# Patient Record
Sex: Male | Born: 1980 | Hispanic: No | Marital: Single | Smoking: Never smoker
Health system: Southern US, Community
[De-identification: ages and names within clinical notes are randomized; demographics above are authoritative.]

## PROBLEM LIST (undated history)

## (undated) DIAGNOSIS — E039 Hypothyroidism, unspecified: Secondary | ICD-10-CM

## (undated) HISTORY — DX: Hypothyroidism, unspecified: E03.9

---

## 2010-04-28 ENCOUNTER — Emergency Department (HOSPITAL_COMMUNITY)
Admission: EM | Admit: 2010-04-28 | Discharge: 2010-04-28 | Payer: Self-pay | Source: Home / Self Care | Admitting: Emergency Medicine

## 2010-04-28 LAB — RAPID STREP SCREEN (MED CTR MEBANE ONLY): Streptococcus, Group A Screen (Direct): NEGATIVE

## 2012-12-27 ENCOUNTER — Encounter: Payer: Self-pay | Admitting: Family Medicine

## 2012-12-27 ENCOUNTER — Ambulatory Visit (INDEPENDENT_AMBULATORY_CARE_PROVIDER_SITE_OTHER): Payer: BC Managed Care – HMO | Admitting: Family Medicine

## 2012-12-27 VITALS — BP 126/75 | HR 60 | Ht 69.0 in | Wt 171.0 lb

## 2012-12-27 DIAGNOSIS — IMO0002 Reserved for concepts with insufficient information to code with codable children: Secondary | ICD-10-CM

## 2012-12-27 DIAGNOSIS — S86899A Other injury of other muscle(s) and tendon(s) at lower leg level, unspecified leg, initial encounter: Secondary | ICD-10-CM

## 2012-12-27 NOTE — Patient Instructions (Addendum)
You have shin splints (medial tibial stress syndrome) Take aleve 2 tabs twice a day with food OR ibuprofen 3 tabs three times a day with food for pain and inflammation for next 3-4 weeks. Icing 3-4 times a day and after activity for 15 minutes at a time Cut down activities by 20-50% (especially running). Orthotics or shoe inserts may be helpful if you have foot breakdown or high arches - try Dr. Jari Sportsman active series insoles. Cross train with non-impact activities (cycling, swimming) every other day. Step exercise I showed you 3 sets of 10 once a day. Consider custom orthotics if you continue to struggle. Follow up with me in 6 weeks or as needed.

## 2012-12-28 ENCOUNTER — Encounter: Payer: Self-pay | Admitting: Family Medicine

## 2012-12-28 DIAGNOSIS — S86899A Other injury of other muscle(s) and tendon(s) at lower leg level, unspecified leg, initial encounter: Secondary | ICD-10-CM | POA: Insufficient documentation

## 2012-12-28 NOTE — Progress Notes (Signed)
Patient ID: Alexander Walsh, male   DOB: March 08, 1981, 32 y.o.   MRN: [id number]  PCP: No primary provider on file.  Subjective:   HPI: Patient is a 32 y.o. male here for bilateral shin pain.  Patient reports he started running about 4 months ago. Worked his way up to doing more than 15 miles a week. Reports past 1 1/2 to 2 months has had pain bilateral shins over a wide area. No swelling, bruising. Seems to feel better in middle of run - worse beginning and end of runs. Has been icing.  Past Medical History  Diagnosis Date  . Hypothyroidism     No current outpatient prescriptions on file prior to visit.   No current facility-administered medications on file prior to visit.    History reviewed. No pertinent past surgical history.  No Known Allergies  History   Social History  . Marital Status: Single    Spouse Name: N/A    Number of Children: N/A  . Years of Education: N/A   Occupational History  . Not on file.   Social History Main Topics  . Smoking status: Never Smoker   . Smokeless tobacco: Not on file  . Alcohol Use: Not on file  . Drug Use: Not on file  . Sexual Activity: Not on file   Other Topics Concern  . Not on file   Social History Narrative  . No narrative on file    Family History  Problem Relation Age of Onset  . Sudden death Neg Hx   . Hyperlipidemia Neg Hx   . Heart attack Neg Hx   . Diabetes Neg Hx   . Hypertension Neg Hx     BP 126/75  Pulse 60  Ht 5\' 9"  (1.753 m)  Wt 171 lb (77.565 kg)  BMI 25.24 kg/m2  Review of Systems: See HPI above.    Objective:  Physical Exam:  Gen: NAD  Bilateral lower legs: No deformity, swelling, bruising.  Overpronation, pes planus. TTP middle 1/3rd tibias.  No other TTP. FROM ankle and knees without pain. Negative thompsons. Negative hop tests.  MSK u/s:  No evidence cortical irregularity, edema overlying cortices, neovascularity.    Assessment & Plan:  1. Bilateral shin splints -  discussed relative rest, icing, tylenol/nsaids.  Orthotics for cushion and to help with overpronation.  Cross train in meantime.  Calf raise/lower exercise.  F/u in 6 weeks or as needed.

## 2012-12-28 NOTE — Assessment & Plan Note (Signed)
discussed relative rest, icing, tylenol/nsaids.  Orthotics for cushion and to help with overpronation.  Cross train in meantime.  Calf raise/lower exercise.  F/u in 6 weeks or as needed.

## 2014-01-08 ENCOUNTER — Ambulatory Visit
Admission: RE | Admit: 2014-01-08 | Discharge: 2014-01-08 | Disposition: A | Discharge status: Home / Self Care | Payer: BC Managed Care – PPO | Source: Ambulatory Visit | Attending: Chiropractic Medicine | Admitting: Chiropractic Medicine

## 2014-01-08 ENCOUNTER — Other Ambulatory Visit: Payer: Self-pay | Admitting: Chiropractic Medicine

## 2014-01-08 DIAGNOSIS — M79662 Pain in left lower leg: Secondary | ICD-10-CM

## 2015-02-12 ENCOUNTER — Encounter: Payer: Self-pay | Admitting: Sports Medicine

## 2015-02-12 ENCOUNTER — Ambulatory Visit (INDEPENDENT_AMBULATORY_CARE_PROVIDER_SITE_OTHER): Payer: BLUE CROSS/BLUE SHIELD

## 2015-02-12 ENCOUNTER — Ambulatory Visit (INDEPENDENT_AMBULATORY_CARE_PROVIDER_SITE_OTHER): Payer: BLUE CROSS/BLUE SHIELD | Admitting: Sports Medicine

## 2015-02-12 DIAGNOSIS — S86891A Other injury of other muscle(s) and tendon(s) at lower leg level, right leg, initial encounter: Secondary | ICD-10-CM

## 2015-02-12 DIAGNOSIS — M79661 Pain in right lower leg: Secondary | ICD-10-CM | POA: Diagnosis not present

## 2015-02-12 MED ORDER — MELOXICAM 15 MG PO TABS: ORAL_TABLET | ORAL | Status: DC

## 2015-02-12 NOTE — Assessment & Plan Note (Signed)
Predominantly right-sided, good hip abductor strength. X-rays, adding meloxicam, return for custom orthotics. He does have an area of fullness and focal tenderness over the medial tibia or some for a stress injury.

## 2015-02-12 NOTE — Progress Notes (Signed)
   Subjective:    I'm seeing this patient as a consultation for:  Dr. Luretha RuedPriya Paruchuri.  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 Overall his pain is getting better but this has been recurrent. Moderate, persistent, localized without radiation. No trauma. No constitutional symptoms.  Past medical history, Surgical history, Family history not pertinant except as noted below, Social history, Allergies, and medications have been entered into the medical record, reviewed, and no changes needed.   Review of Systems: No headache, visual changes, nausea, vomiting, diarrhea, constipation, dizziness, abdominal pain, skin rash, fevers, chills, night sweats, weight loss, swollen lymph nodes, body aches, joint swelling, muscle aches, chest pain, shortness of breath, mood changes, visual or auditory hallucinations.   Objective:   General: Well Developed, well nourished, and in no acute distress.  Neuro/Psych: Alert and oriented x3, extra-ocular muscles intact, able to move all 4 extremities, sensation grossly intact. Skin: Warm and dry, no rashes noted.  Respiratory: Not using accessory muscles, speaking in full sentences, trachea midline.  Cardiovascular: Pulses palpable, no extremity edema. Abdomen: Does not appear distended. Right Ankle: No visible erythema or swelling. Range of motion is full in all directions. Strength is 5/5 in all directions. Stable lateral and medial ligaments; squeeze test and kleiger test unremarkable; Talar dome nontender; No pain at base of 5th MT; No tenderness over cuboid; No tenderness over N spot or navicular prominence Tender to palpation at the posterior medial border of the junction of the middle and distal thirds of the tibia. No sign of peroneal tendon subluxations; Negative tarsal  tunnel tinel's Able to walk 4 steps.  X-rays personally reviewed and are unremarkable.  Impression and Recommendations:   This case required medical decision making of moderate complexity.

## 2015-02-25 ENCOUNTER — Encounter: Payer: Self-pay | Admitting: Sports Medicine

## 2015-02-25 ENCOUNTER — Ambulatory Visit (INDEPENDENT_AMBULATORY_CARE_PROVIDER_SITE_OTHER): Payer: BLUE CROSS/BLUE SHIELD | Admitting: Sports Medicine

## 2015-02-25 VITALS — BP 126/77 | HR 62

## 2015-02-25 DIAGNOSIS — S86891A Other injury of other muscle(s) and tendon(s) at lower leg level, right leg, initial encounter: Secondary | ICD-10-CM

## 2015-02-25 NOTE — Assessment & Plan Note (Addendum)
X-ray evidence of an old stress fracture, continue NSAIDs, custom orthotics as above. Adding an Aircast. Avoid running for 2 weeks and then may restart training for marathon. Return in one month.

## 2015-02-25 NOTE — Progress Notes (Signed)

## 2015-04-29 ENCOUNTER — Encounter: Payer: Self-pay | Admitting: Sports Medicine

## 2015-04-29 ENCOUNTER — Ambulatory Visit (INDEPENDENT_AMBULATORY_CARE_PROVIDER_SITE_OTHER): Payer: BLUE CROSS/BLUE SHIELD | Admitting: Sports Medicine

## 2015-04-29 VITALS — BP 134/74 | HR 65 | Resp 18 | Wt 168.3 lb

## 2015-04-29 DIAGNOSIS — S86891D Other injury of other muscle(s) and tendon(s) at lower leg level, right leg, subsequent encounter: Secondary | ICD-10-CM | POA: Diagnosis not present

## 2015-04-29 NOTE — Assessment & Plan Note (Signed)
X-ray did show evidence of a tibial stress fracture. This has completely resolved with custom orthotics and an Aircast. He does have a bit of medial tibial pain bilaterally that resembles medial tibial stress syndrome, we added a small scaphoid pad into both orthotics, and he will continue to increase his mileage. I have asked him to touch base with me on mychart in one month to see how things are going.

## 2015-04-29 NOTE — Progress Notes (Addendum)
  Subjective:    CC: Follow-up  HPI: This is a pleasant 35 year old male runner, we diagnosed him with a tibial stress fracture with x-ray evidence of periosteal reaction. I placed him and custom orthotics and an Aircast, he returns today with all pain at the right medial tibia resolved, he has recently gotten back into running and is doing approximately 4 miles at a time, and has only mild, relatively diffuse pain at the medial tibia bilaterally.  Past medical history, Surgical history, Family history not pertinant except as noted below, Social history, Allergies, and medications have been entered into the medical record, reviewed, and no changes needed.   Review of Systems: No fevers, chills, night sweats, weight loss, chest pain, or shortness of breath.   Objective:    General: Well Developed, well nourished, and in no acute distress.  Neuro: Alert and oriented x3, extra-ocular muscles intact, sensation grossly intact.  HEENT: Normocephalic, atraumatic, pupils equal round reactive to light, neck supple, no masses, no lymphadenopathy, thyroid nonpalpable.  Skin: Warm and dry, no rashes. Cardiac: Regular rate and rhythm, no murmurs rubs or gallops, no lower extremity edema.  Respiratory: Clear to auscultation bilaterally. Not using accessory muscles, speaking in full sentences. Bilateral Ankles: No visible erythema or swelling. Range of motion is full in all directions. Strength is 5/5 in all directions. Stable lateral and medial ligaments; squeeze test and kleiger test unremarkable; Talar dome nontender; No pain at base of 5th MT; No tenderness over cuboid; No tenderness over N spot or navicular prominence No tenderness on posterior aspects of lateral and medial malleolus No sign of peroneal tendon subluxations; Negative tarsal tunnel tinel's Able to walk 4 steps.  Impression and Recommendations:    I spent 25 minutes with this patient, greater than 50% was face-to-face time  counseling regarding the above diagnoses

## 2016-04-08 ENCOUNTER — Ambulatory Visit (INDEPENDENT_AMBULATORY_CARE_PROVIDER_SITE_OTHER): Payer: BLUE CROSS/BLUE SHIELD | Admitting: Sports Medicine

## 2016-04-08 ENCOUNTER — Ambulatory Visit (INDEPENDENT_AMBULATORY_CARE_PROVIDER_SITE_OTHER): Payer: BLUE CROSS/BLUE SHIELD

## 2016-04-08 ENCOUNTER — Encounter: Payer: Self-pay | Admitting: Sports Medicine

## 2016-04-08 DIAGNOSIS — S76012A Strain of muscle, fascia and tendon of left hip, initial encounter: Secondary | ICD-10-CM | POA: Diagnosis not present

## 2016-04-08 DIAGNOSIS — M25552 Pain in left hip: Secondary | ICD-10-CM

## 2016-04-08 MED ORDER — MELOXICAM 15 MG PO TABS: ORAL_TABLET | ORAL | 3 refills | Status: DC

## 2016-04-08 NOTE — Progress Notes (Signed)
   Subjective:    I'm seeing this patient as a consultation for:  Dr. Louis MeckelParuchuri CC: Left hip pain  HPI: Patient is 36yo male presenting with left hip pain since his previous marathon on 02/05/16.  Patient states his pain is worse when he bends his hip or after mile 3 of this tempo runs.  Patient states he can run through the pain.  Patient denies any numbness or tingling of left leg.  Denies knee pain or back pain. Patient states he has tried yoga, which he wonders if has made his hip worse.  Patient states the only thing he has tried for the pain is ice yesterday with minimal relief.  Patient is worried because he wants to increase his workouts for his next race in March.      Past medical history:  Negative.  See flowsheet/record as well for more information.  Surgical history: Negative.  See flowsheet/record as well for more information.  Family history: Negative.  See flowsheet/record as well for more information.  Social history: Negative.  See flowsheet/record as well for more information.  Allergies, and medications have been entered into the medical record, reviewed, and no changes needed.   Review of Systems: No headache, visual changes, nausea, vomiting, diarrhea, constipation, dizziness, abdominal pain, skin rash, fevers, chills, night sweats, weight loss, swollen lymph nodes, body aches, joint swelling, muscle aches, chest pain, shortness of breath, mood changes, visual or auditory hallucinations.   Objective:   General: Well Developed, well nourished, and in no acute distress.  Neuro/Psych: Alert and oriented x3, extra-ocular muscles intact, able to move all 4 extremities, sensation grossly intact. Skin: Warm and dry, no rashes noted.  Respiratory: Not using accessory muscles, speaking in full sentences, trachea midline.  Cardiovascular: Pulses palpable, no extremity edema. Abdomen: Does not appear distended. Left Hip: ROM IR: 45 Deg, ER: 45 Deg, Flexion: 120 Deg, Extension:  100 Deg, Abduction: 45 Deg, Adduction: 45 Deg Pain with flexion but appropriate ROM.   Strength IR: 5/5, ER: 5/5, Flexion: 5/5, Extension: 5/5, Abduction: 4/5, Adduction: 5/5 Weakness of abductors.   Pelvic alignment unremarkable to inspection and palpation. Standing hip rotation and gait without trendelenburg sign / unsteadiness. Greater trochanter without tenderness to palpation. No tenderness over piriformis. No pain with FABER or FADIR. No SI joint tenderness and normal minimal SI movement.       Impression and Recommendations:   This case required medical decision making of moderate complexity.  No problem-specific Assessment & Plan notes found for this encounter.     Marland Kitchen.Ok Edwards.Darek was seen today for hip pain.  Diagnoses and all orders for this visit:  Strain of flexor muscle of left hip, initial encounter -     meloxicam (MOBIC) 15 MG tablet; One tab PO qAM with breakfast for 2 weeks, then daily prn pain. -     DG HIP UNILAT WITH PELVIS 2-3 VIEWS LEFT; Future  Patient has a flexor muscle strain. Patient informed he can take Mobic 15mg  for pain relief.  He was also given instructions for hip exercises to do for the next 4 weeks to strengthen his hip abductors and flexors.  Patient also instructed to run a maximum of 10 miles this week, and he can increase his workouts the following week.  Patient instructed he can continue his yoga exercises as well since they are likely helping to strength his hip abductors.   Patient needs to follow-up in 4 weeks.

## 2016-04-08 NOTE — Assessment & Plan Note (Signed)
Pain for one month now after a marathon. Left hip x-rays, meloxicam, hip flexor rehabilitation exercises. Also has fairly weak hip abductor's which she will work on.  Return to see me in one month.

## 2016-05-06 ENCOUNTER — Ambulatory Visit (INDEPENDENT_AMBULATORY_CARE_PROVIDER_SITE_OTHER): Payer: BLUE CROSS/BLUE SHIELD | Admitting: Sports Medicine

## 2016-05-06 ENCOUNTER — Encounter: Payer: Self-pay | Admitting: Sports Medicine

## 2016-05-06 DIAGNOSIS — S76012D Strain of muscle, fascia and tendon of left hip, subsequent encounter: Secondary | ICD-10-CM | POA: Diagnosis not present

## 2016-05-06 NOTE — Progress Notes (Signed)
  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 He has improved significantly, he was pain-free until going up on his mileage to past 20 miles per week. Only with a slight recurrence of pain, he did stop his meloxicam. Next 20 takes he continues to be totally pain-free. Admits to not being fully diligent with his rehabilitation exercises.  Past medical history:  Negative.  See flowsheet/record as well for more information.  Surgical history: Negative.  See flowsheet/record as well for more information.  Family history: Negative.  See flowsheet/record as well for more information.  Social history: Negative.  See flowsheet/record as well for more information.  Allergies, and medications have been entered into the medical record, reviewed, and no changes needed.   Review of Systems: No fevers, chills, night sweats, weight loss, chest pain, or shortness of breath.   Objective:    General: Well Developed, well nourished, and in no acute distress.  Neuro: Alert and oriented x3, extra-ocular muscles intact, sensation grossly intact.  HEENT: Normocephalic, atraumatic, pupils equal round reactive to light, neck supple, no masses, no lymphadenopathy, thyroid nonpalpable.  Skin: Warm and dry, no rashes. Cardiac: Regular rate and rhythm, no murmurs rubs or gallops, no lower extremity edema.  Respiratory: Clear to auscultation bilaterally. Not using accessory muscles, speaking in full sentences. Left Hip: ROM IR: 60 Deg, ER: 60 Deg, Flexion: 120 Deg, Extension: 100 Deg, Abduction: 45 Deg, Adduction: 45 Deg Strength IR: 5/5, ER: 5/5, Flexion: 5/5, Extension: 5/5, Abduction: 5/5, Adduction: 5/5, mild reproduction of pain with resisted flexion of the hip Pelvic alignment unremarkable to inspection and palpation. Standing hip rotation and gait without trendelenburg /  unsteadiness. Greater trochanter without tenderness to palpation. No tenderness over piriformis. No SI joint tenderness and normal minimal SI movement.  Impression and Recommendations:    Strain of flexor muscle of left hip Fantastic improvement, up to 26 miles per week. He will decreased back to 20 miles per week and continue meloxicam. He does need to increase his diligence with the physical therapy, I am going to send him to a physical therapist of his choice.  I spent 25 minutes with this patient, greater than 50% was face-to-face time counseling regarding the above diagnoses

## 2016-05-06 NOTE — Assessment & Plan Note (Signed)
Fantastic improvement, up to 26 miles per week. He will decreased back to 20 miles per week and continue meloxicam. He does need to increase his diligence with the physical therapy, I am going to send him to a physical therapist of his choice.

## 2016-06-03 ENCOUNTER — Ambulatory Visit: Payer: BLUE CROSS/BLUE SHIELD | Admitting: Sports Medicine

## 2016-07-28 IMAGING — CR DG TIBIA/FIBULA 2V*R*
4 series · 4 of 4 positions shown · non-contrast
Comparison: None.

CLINICAL DATA: Tender to palpation along the medial tibial border.

EXAM:
RIGHT TIBIA AND FIBULA - 2 VIEW

[tibia ap (1 of 2)]
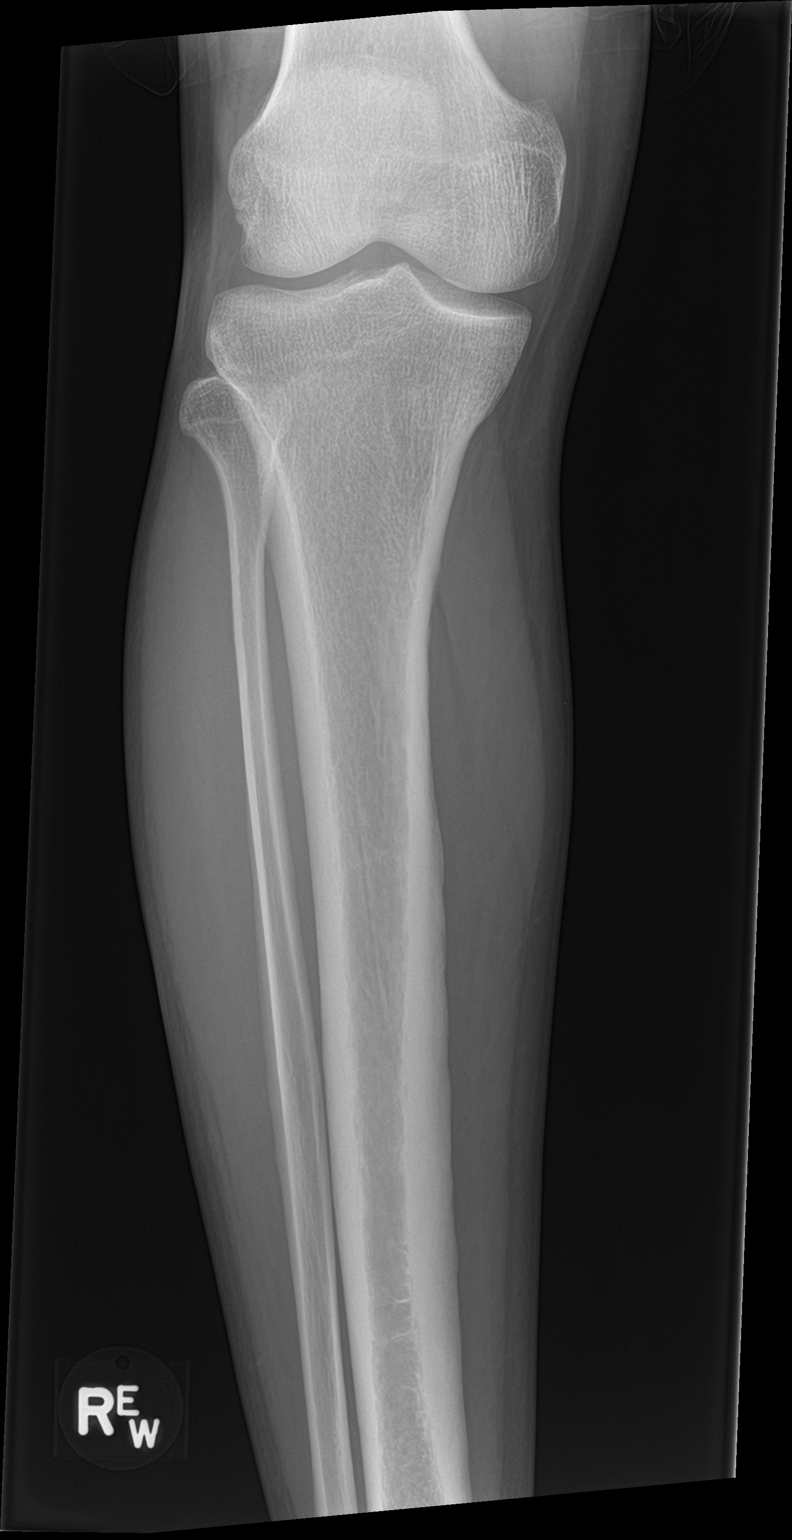

[tibia ap (2 of 2)]
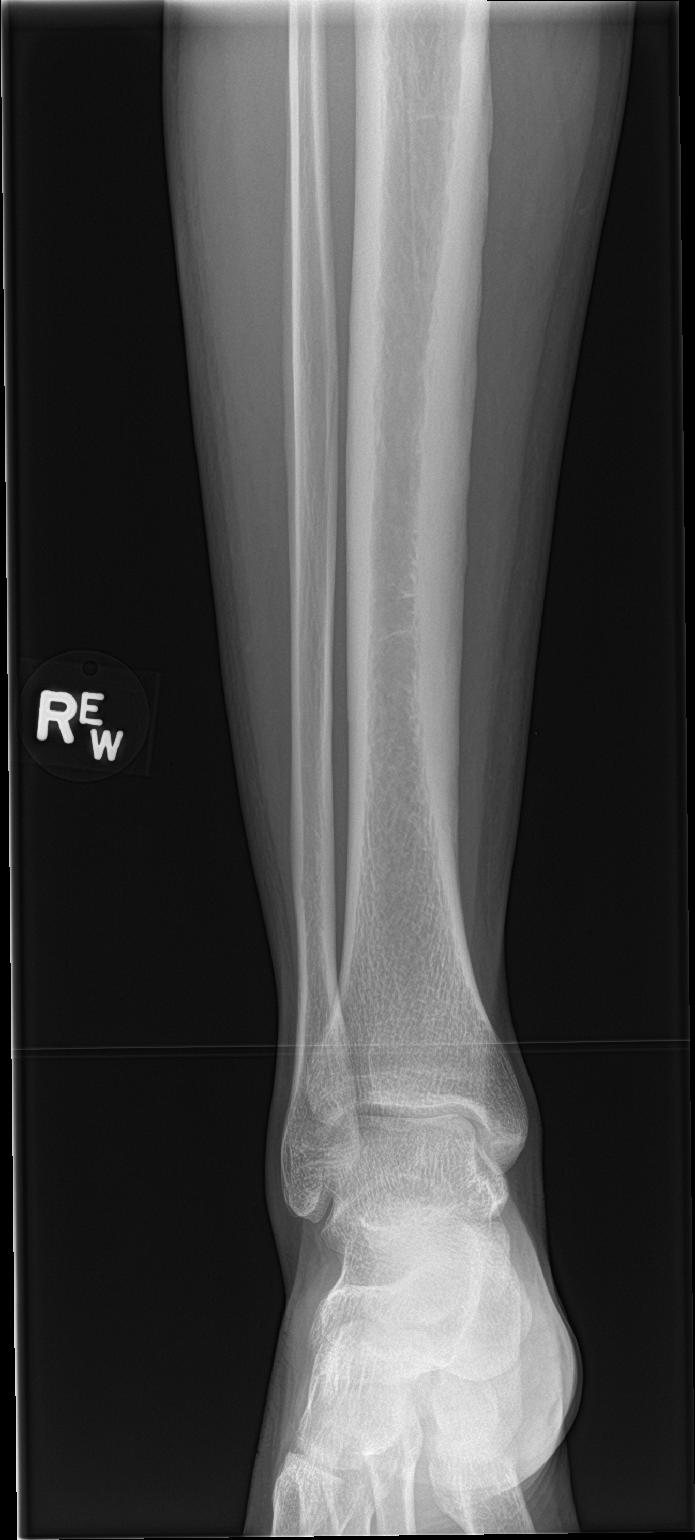

[tibia lat (1 of 2)]
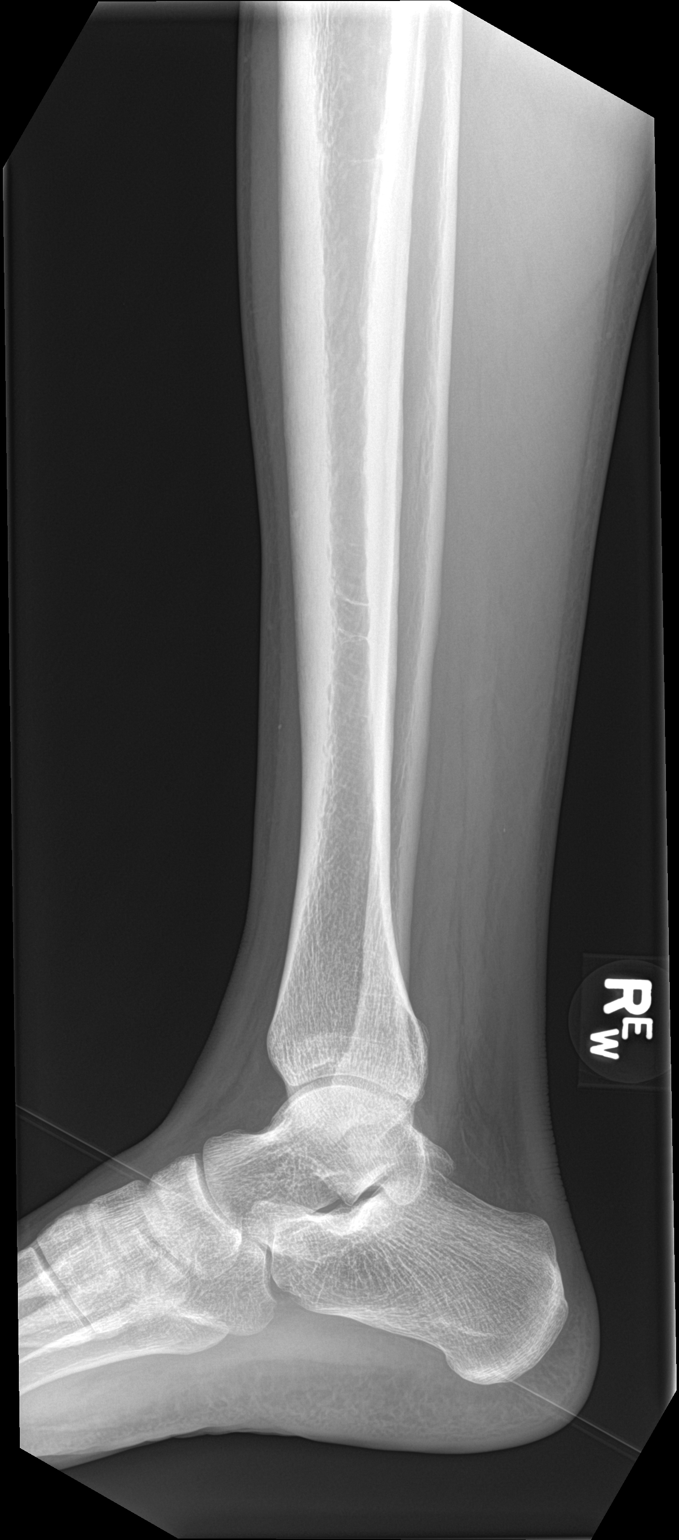

[tibia lat (2 of 2)]
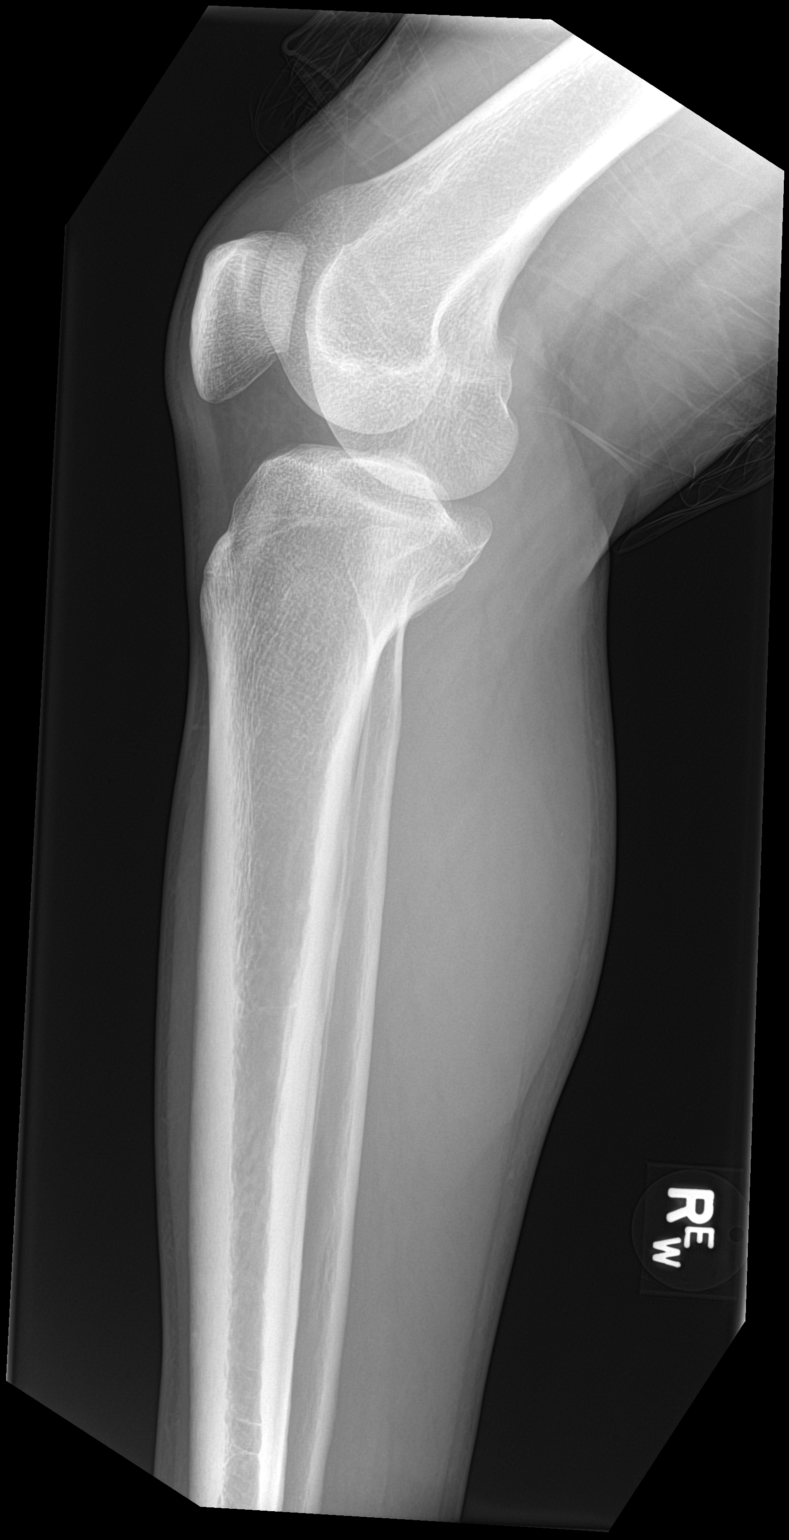

[4 of 4 positions shown; findings below may reference images not displayed]

FINDINGS: There is no evidence of fracture or other focal bone lesions. There
is cortical thickening along the medial distal tibial diaphysis
which may be secondary to chronic stress reaction. Soft tissues are
unremarkable.
IMPRESSION: No acute osseous injury of the right tibia or fibula.

There is cortical thickening along the medial distal tibial
diaphysis which may be secondary to chronic stress reaction.

## 2016-08-03 ENCOUNTER — Encounter: Payer: Self-pay | Admitting: Sports Medicine

## 2016-08-03 ENCOUNTER — Ambulatory Visit (INDEPENDENT_AMBULATORY_CARE_PROVIDER_SITE_OTHER): Payer: BLUE CROSS/BLUE SHIELD | Admitting: Sports Medicine

## 2016-08-03 DIAGNOSIS — S76012D Strain of muscle, fascia and tendon of left hip, subsequent encounter: Secondary | ICD-10-CM | POA: Diagnosis not present

## 2016-08-03 NOTE — Assessment & Plan Note (Signed)
Completely resolved, he will continue to work on midfoot and forefoot running, has been doing all aerobic exercise, needs to add some resistance training particularly for the hip flexors and extensors.

## 2016-08-03 NOTE — Progress Notes (Signed)
   Subjective:    I'm seeing this patient as a consultation for:    CC: follow-up left hip flexor strain  HPI: 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 Hip pain has completely resolved after adjusting his gait and cadence with PT. Patient also reports an incident of right knee pain a couple of weeks ago that first started when he ramped up his cycling. The pain has since resolved after adjusting the height of his seat. Patient is not acutely in pain on presentation.   Past medical history:  Right medial tibial stress syndrome, left hip flexor strain.  See flowsheet/record as well for more information.  Surgical history: Negative.  See flowsheet/record as well for more information.  Family history: Negative.  See flowsheet/record as well for more information.  Social history: Negative.  See flowsheet/record as well for more information.  Allergies, and medications have been entered into the medical record, reviewed, and no changes needed.   Review of Systems: No headache, visual changes, nausea, vomiting, diarrhea, constipation, dizziness, abdominal pain, skin rash, fevers, chills, night sweats, weight loss, swollen lymph nodes, body aches, joint swelling, muscle aches, chest pain, shortness of breath, mood changes, visual or auditory hallucinations.   Objective:   General: Well Developed, well nourished, and in no acute distress.  Neuro/Psych: Alert and oriented x3, extra-ocular muscles intact, able to move all 4 extremities, sensation grossly intact. Skin: Warm and dry, no rashes noted.  Respiratory: Not using accessory muscles, speaking in full sentences, trachea midline.  Cardiovascular: Pulses palpable, no extremity edema. Abdomen: Does not appear distended. MSK: strength and ROM full in all planes hip and knee joints bilaterally. No tenderness to palpation bilateral lower limbs.   Impression and Recommendations:   This case  required medical decision making of moderate complexity.  [name] is a 36 y.o. Male marathon runner with a history of right medial tibial stress syndrome who presents for follow-up for left hip flexor strain.  Strain has completely resolved. Pt will continue working on midfoot and forefoot running. Pt advised to add resistance training in conjunction with his aerobic exercise, particularly focusing on hip flexors and extensors.

## 2016-11-12 ENCOUNTER — Ambulatory Visit (INDEPENDENT_AMBULATORY_CARE_PROVIDER_SITE_OTHER): Payer: BLUE CROSS/BLUE SHIELD | Admitting: Licensed Clinical Social Worker

## 2016-11-12 DIAGNOSIS — F419 Anxiety disorder, unspecified: Secondary | ICD-10-CM

## 2016-11-23 ENCOUNTER — Ambulatory Visit (INDEPENDENT_AMBULATORY_CARE_PROVIDER_SITE_OTHER): Payer: BLUE CROSS/BLUE SHIELD | Admitting: Licensed Clinical Social Worker

## 2016-11-23 ENCOUNTER — Ambulatory Visit: Payer: BLUE CROSS/BLUE SHIELD | Admitting: Licensed Clinical Social Worker

## 2016-11-23 DIAGNOSIS — F419 Anxiety disorder, unspecified: Secondary | ICD-10-CM

## 2016-11-26 ENCOUNTER — Encounter: Payer: Self-pay | Admitting: Sports Medicine

## 2016-11-26 ENCOUNTER — Ambulatory Visit (INDEPENDENT_AMBULATORY_CARE_PROVIDER_SITE_OTHER): Payer: BLUE CROSS/BLUE SHIELD | Admitting: Sports Medicine

## 2016-11-26 DIAGNOSIS — M722 Plantar fascial fibromatosis: Secondary | ICD-10-CM

## 2016-11-26 NOTE — Progress Notes (Signed)
   Subjective:    I'm seeing this patient as a consultation for:  Dr. Louis MeckelParuchuri.  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  For the past several days he said increasing pain on the plantar aspect of his right heel, worse with first few steps in the morning, moderate, worsening without radiation. He does tend to walk barefoot at home. He got some over-the-counter rigid orthotics that have really not been efficacious.  Past medical history, Surgical history, Family history not pertinant except as noted below, Social history, Allergies, and medications have been entered into the medical record, reviewed, and no changes needed.   Review of Systems: No headache, visual changes, nausea, vomiting, diarrhea, constipation, dizziness, abdominal pain, skin rash, fevers, chills, night sweats, weight loss, swollen lymph nodes, body aches, joint swelling, muscle aches, chest pain, shortness of breath, mood changes, visual or auditory hallucinations.   Objective:   General: Well Developed, well nourished, and in no acute distress.  Neuro:  Extra-ocular muscles intact, able to move all 4 extremities, sensation grossly intact.  Deep tendon reflexes tested were normal. Psych: Alert and oriented, mood congruent with affect. ENT:  Ears and nose appear unremarkable.  Hearing grossly normal. Neck: Unremarkable overall appearance, trachea midline.  No visible thyroid enlargement. Eyes: Conjunctivae and lids appear unremarkable.  Pupils equal and round. Skin: Warm and dry, no rashes noted.  Cardiovascular: Pulses palpable, no extremity edema. Right Foot: No visible erythema or swelling. Range of motion is full in all directions. Strength is 5/5 in all directions. No hallux valgus. No pes cavus or pes planus. No abnormal callus noted. No pain over the navicular prominence, or base of fifth metatarsal. Mild tenderness to palpation of the calcaneal  insertion of plantar fascia. No pain at the Achilles insertion. No pain over the calcaneal bursa. No pain of the retrocalcaneal bursa. No tenderness to palpation over the tarsals, metatarsals, or phalanges. No hallux rigidus or limitus. No tenderness palpation over interphalangeal joints. No pain with compression of the metatarsal heads. Neurovascularly intact distally.  Impression and Recommendations:   This case required medical decision making of moderate complexity.  Plantar fasciitis of left foot Does have a big marathon coming up. Avoid barefoot walking, nighttime splint, continue meloxicam, home rehabilitation exercises given, he will place his orthotics and all of his shoes. May return for new set of custom orthotics if he desires.

## 2016-11-26 NOTE — Assessment & Plan Note (Signed)
Does have a big marathon coming up. Avoid barefoot walking, nighttime splint, continue meloxicam, home rehabilitation exercises given, he will place his orthotics and all of his shoes. May return for new set of custom orthotics if he desires.

## 2016-12-07 ENCOUNTER — Ambulatory Visit (INDEPENDENT_AMBULATORY_CARE_PROVIDER_SITE_OTHER): Payer: BLUE CROSS/BLUE SHIELD | Admitting: Licensed Clinical Social Worker

## 2016-12-07 DIAGNOSIS — F419 Anxiety disorder, unspecified: Secondary | ICD-10-CM | POA: Diagnosis not present

## 2016-12-24 ENCOUNTER — Ambulatory Visit: Payer: Self-pay | Admitting: Sports Medicine

## 2016-12-28 ENCOUNTER — Ambulatory Visit (INDEPENDENT_AMBULATORY_CARE_PROVIDER_SITE_OTHER): Payer: BLUE CROSS/BLUE SHIELD | Admitting: Sports Medicine

## 2016-12-28 ENCOUNTER — Encounter: Payer: Self-pay | Admitting: Sports Medicine

## 2016-12-28 DIAGNOSIS — M722 Plantar fascial fibromatosis: Secondary | ICD-10-CM

## 2016-12-28 NOTE — Progress Notes (Signed)
  Subjective:    CC: Follow-up  HPI: Plantar fasciitis: Doing okay, see further below in assessment and plan for further details.  Past medical history:  Negative.  See flowsheet/record as well for more information.  Surgical history: Negative.  See flowsheet/record as well for more information.  Family history: Negative.  See flowsheet/record as well for more information.  Social history: Negative.  See flowsheet/record as well for more information.  Allergies, and medications have been entered into the medical record, reviewed, and no changes needed.   Review of Systems: No fevers, chills, night sweats, weight loss, chest pain, or shortness of breath.   Objective:    General: Well Developed, well nourished, and in no acute distress.  Neuro: Alert and oriented x3, extra-ocular muscles intact, sensation grossly intact.  HEENT: Normocephalic, atraumatic, pupils equal round reactive to light, neck supple, no masses, no lymphadenopathy, thyroid nonpalpable.  Skin: Warm and dry, no rashes. Cardiac: Regular rate and rhythm, no murmurs rubs or gallops, no lower extremity edema.  Respiratory: Clear to auscultation bilaterally. Not using accessory muscles, speaking in full sentences. Left Foot: No visible erythema or swelling. Range of motion is full in all directions. Strength is 5/5 in all directions. No hallux valgus. No pes cavus or pes planus. No abnormal callus noted. No pain over the navicular prominence, or base of fifth metatarsal. No tenderness to palpation of the calcaneal insertion of plantar fascia. No pain at the Achilles insertion. No pain over the calcaneal bursa. No pain of the retrocalcaneal bursa. No tenderness to palpation over the tarsals, metatarsals, or phalanges. No hallux rigidus or limitus. No tenderness palpation over interphalangeal joints. No pain with compression of the metatarsal heads. Neurovascularly intact distally.  Impression and Recommendations:      Plantar fasciitis of left foot Significant improvement with custom orthotics, rehabilitation exercises. Still with some discomfort after riding his road bike, only using the nighttime splint an hour or an hour and a half at night. I he will increase compliance with a nighttime splint, move the baseplate back on the road bike, and ice his foot after long rides. His marathon was canceled, and he is going to [place], he will return for new set of orthotics.  I spent 25 minutes with this patient, greater than 50% was face-to-face time counseling regarding the above diagnoses ___________________________________________ Ihor Austin. Benjamin Stain, M.D., ABFM., CAQSM. Primary Care and Sports Medicine [facility]  Adjunct Instructor of Family Medicine  [employer]

## 2016-12-28 NOTE — Assessment & Plan Note (Signed)
Significant improvement with custom orthotics, rehabilitation exercises. Still with some discomfort after riding his road bike, only using the nighttime splint an hour or an hour and a half at night. I he will increase compliance with a nighttime splint, move the baseplate back on the road bike, and ice his foot after long rides. His marathon was canceled, and he is going to [place], he will return for new set of orthotics.

## 2016-12-30 ENCOUNTER — Ambulatory Visit: Payer: Self-pay | Admitting: Licensed Clinical Social Worker

## 2017-01-03 ENCOUNTER — Ambulatory Visit (INDEPENDENT_AMBULATORY_CARE_PROVIDER_SITE_OTHER): Payer: BLUE CROSS/BLUE SHIELD | Admitting: Licensed Clinical Social Worker

## 2017-01-03 DIAGNOSIS — F419 Anxiety disorder, unspecified: Secondary | ICD-10-CM | POA: Diagnosis not present

## 2017-02-02 ENCOUNTER — Ambulatory Visit (INDEPENDENT_AMBULATORY_CARE_PROVIDER_SITE_OTHER): Payer: BLUE CROSS/BLUE SHIELD | Admitting: Licensed Clinical Social Worker

## 2017-02-02 DIAGNOSIS — F419 Anxiety disorder, unspecified: Secondary | ICD-10-CM | POA: Diagnosis not present

## 2017-02-07 ENCOUNTER — Encounter: Payer: Self-pay | Admitting: Sports Medicine

## 2017-02-07 ENCOUNTER — Ambulatory Visit: Payer: BLUE CROSS/BLUE SHIELD | Admitting: Sports Medicine

## 2017-02-07 DIAGNOSIS — M722 Plantar fascial fibromatosis: Secondary | ICD-10-CM

## 2017-02-07 NOTE — Progress Notes (Signed)
    Patient was fitted for a : standard, cushioned, semi-rigid orthotic. The orthotic was heated and afterward the patient stood on the orthotic blank positioned on the orthotic stand. The patient was positioned in subtalar neutral position and 10 degrees of ankle dorsiflexion in a weight bearing stance. After completion of molding, a stable base was applied to the orthotic blank. The blank was ground to a stable position for weight bearing. Size: 10 Base: White EVA Additional Posting and Padding: None The patient ambulated these, and they were very comfortable.  I spent 40 minutes with this patient, greater than 50% was face-to-face time counseling regarding the below diagnosis.  ___________________________________________ Bethzy Hauck J. Janiya Millirons, M.D., ABFM., CAQSM. Primary Care and Sports Medicine [facility]  Adjunct Instructor of Family Medicine  [employer]   

## 2017-02-07 NOTE — Assessment & Plan Note (Signed)
Resolved, new set of orthotics as above.

## 2017-03-03 ENCOUNTER — Ambulatory Visit: Payer: Self-pay | Admitting: Licensed Clinical Social Worker

## 2017-09-22 IMAGING — DX DG HIP (WITH OR WITHOUT PELVIS) 2-3V*L*
3 series · 3 of 3 positions shown · non-contrast
Comparison: None.

CLINICAL DATA: Left lateral hip pain for 1 month following running
a marathon, initial encounter

EXAM:
DG HIP (WITH OR WITHOUT PELVIS) 3V LEFT

[pelvis ap]
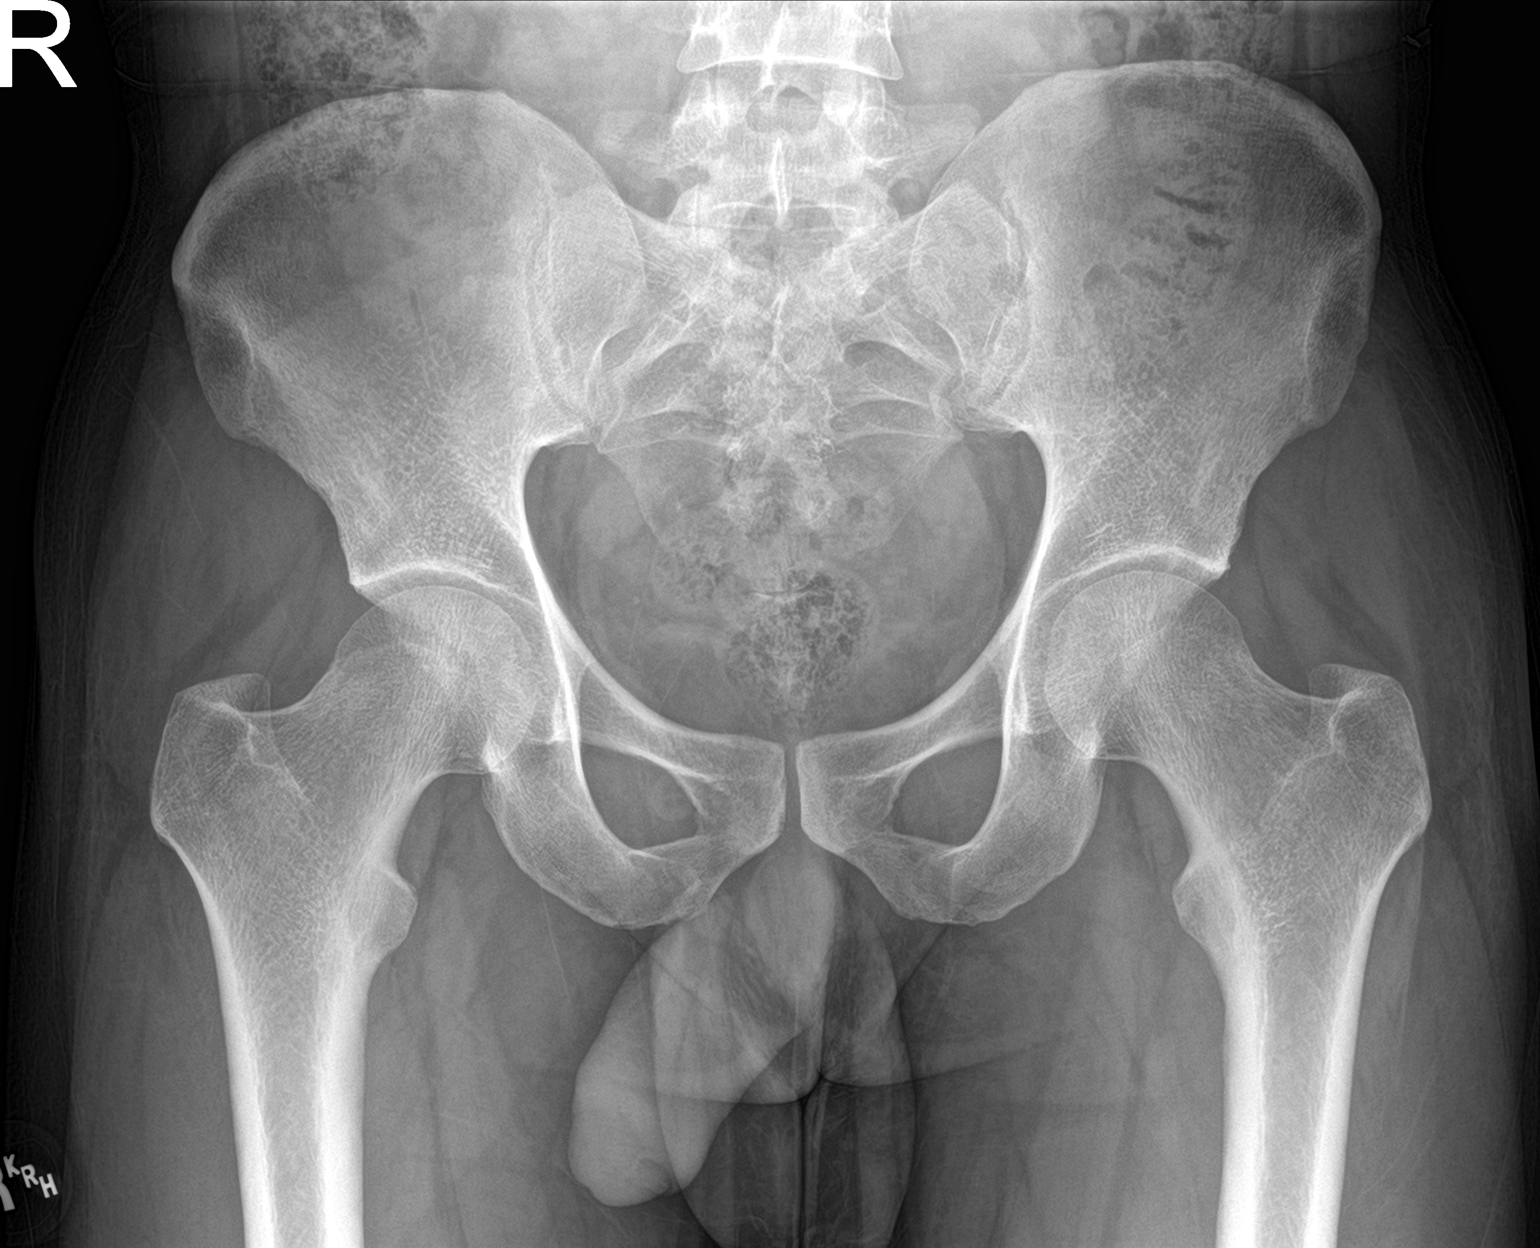

[hip ap]
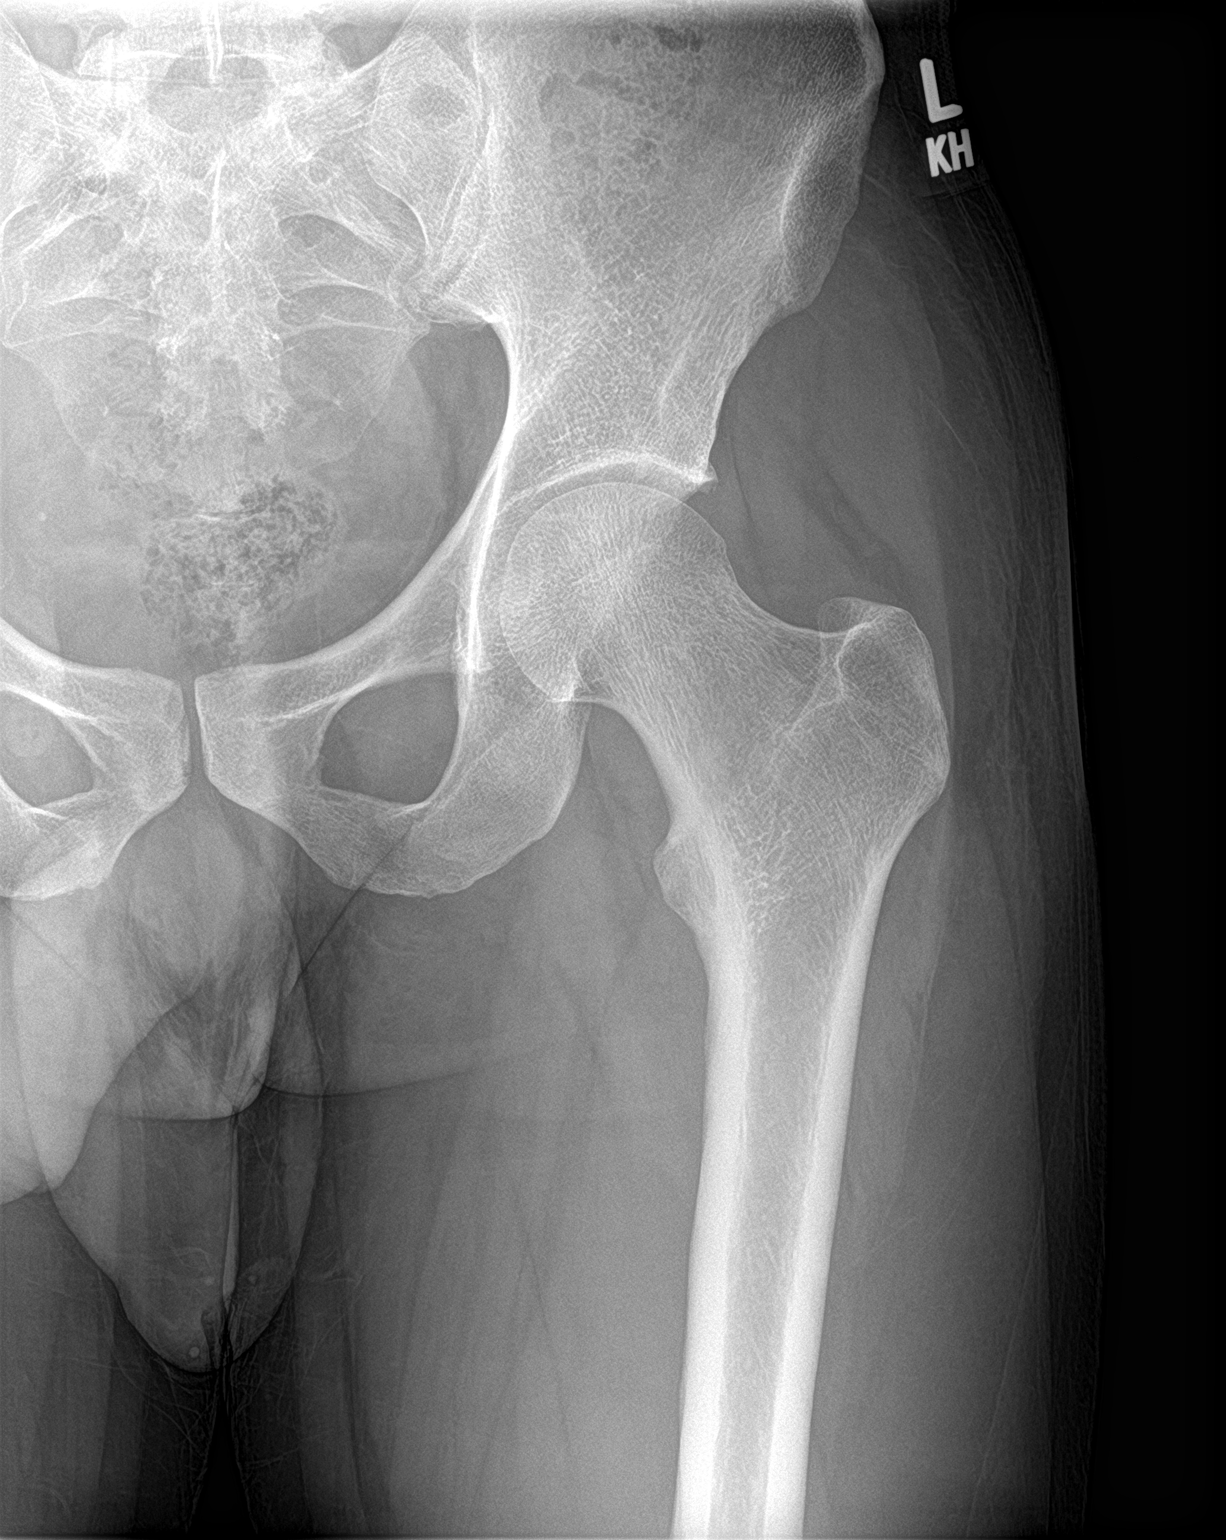

[hip lat]
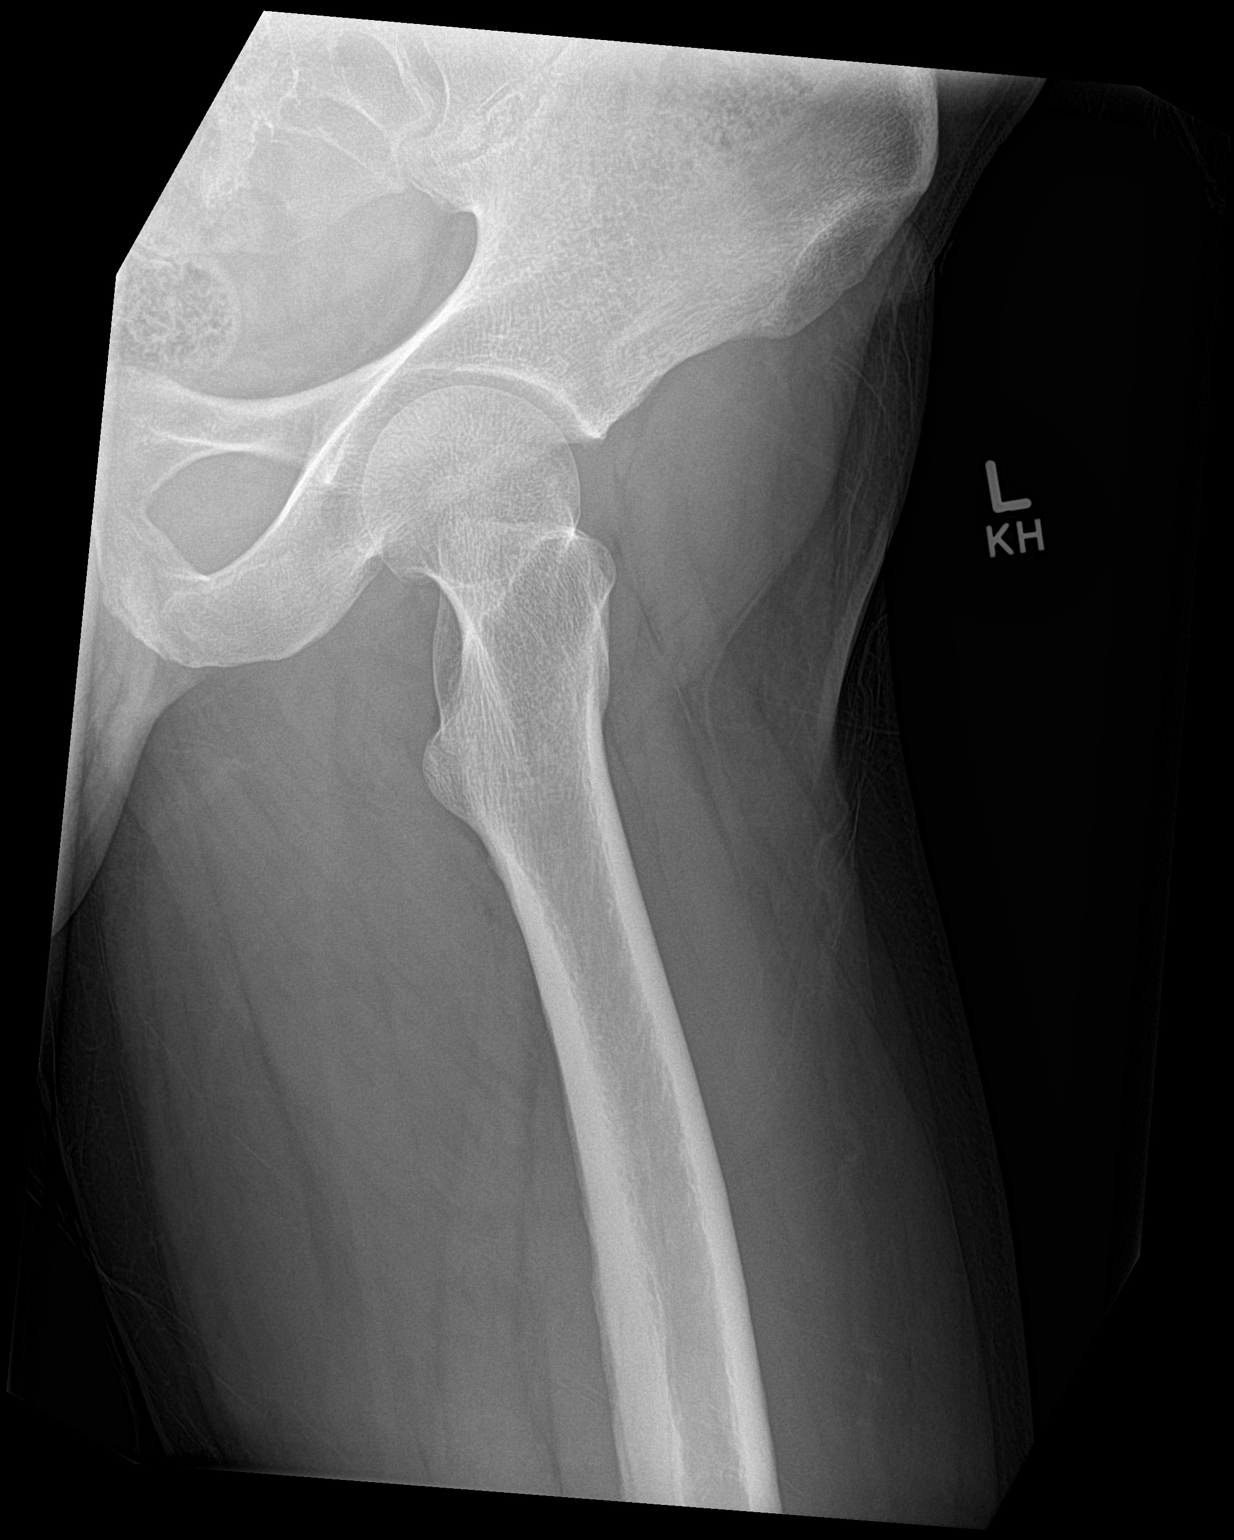

[3 of 3 positions shown; findings below may reference images not displayed]

FINDINGS: No acute fracture or dislocation is noted. No gross soft tissue
abnormality is seen. Pelvic ring as visualized is within normal
limits.
IMPRESSION: No acute abnormality noted.

## 2018-01-03 ENCOUNTER — Encounter: Payer: Self-pay | Admitting: Sports Medicine

## 2018-01-03 ENCOUNTER — Ambulatory Visit: Payer: BLUE CROSS/BLUE SHIELD | Admitting: Sports Medicine

## 2018-01-03 DIAGNOSIS — M84375A Stress fracture, left foot, initial encounter for fracture: Secondary | ICD-10-CM

## 2018-01-03 NOTE — Assessment & Plan Note (Addendum)
Lateral heel wedge in his daily shoes, custom orthotics. I did recommend a postop shoe except when training but he declines for now. I did recommend that he simply wear the most rigid soled shoe he can when not training. 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 X-rays today.

## 2018-01-03 NOTE — Patient Instructions (Signed)
Stress Fracture Stress fracture is a small break or crack in a bone. A stress fracture can be fully broken (complete) or partially broken (incomplete). The most common sites for stress fractures are the bones in the front of your feet (metatarsals), your heels (calcaneus), and the long bone of your lower leg (tibia). What are the causes? A stress fracture is caused by overuse or repetitive exercise, such as running. It happens when a bone cannot absorb any more shock because the muscles around it are weak. Stress fractures happen most commonly when:  You rapidly increase or start a new physical activity.  You use shoes that are worn out or do not fit you properly.  You exercise on a new surface.  What increases the risk? You may be at higher risk for this type of fracture if:  You have a condition that causes weak bones (osteoporosis).  You are male. Stress fractures are more likely to occur in women.  What are the signs or symptoms? The most common symptom of a stress fracture is feeling pain when you are using the affected part of your body. The pain usually goes away when you are resting. Other symptoms may include:  Swelling of the affected area.  Pain in the area when it is touched.  Decreased pain while resting.  Stress fracture pain usually develops over time. How is this diagnosed? Diagnosis may include:  Medical history and physical exam.  X-rays.  Bone scan.  MRI.  How is this treated? Treatment depends on the severity of your stress fracture. Treatment usually involves resting, icing, compression, and elevation (RICE) of the affected part of your body. Treatment may also include:  Medicines to reduce inflammation.  A cast or a walking shoe.  Crutches.  Surgery.  Follow these instructions at home: If you have a cast:  Do not stick anything inside the cast to scratch your skin. Doing that increases your risk of infection.  Check the skin around the  cast every day. Report any concerns to your health care provider. You may put lotion on dry skin around the edges of the cast. Do not apply lotion to the skin underneath the cast.  Keep the cast clean and dry.  Cover the cast with a watertight plastic bag to protect it from water while you take a bath or a shower. Do not let the cast get wet.  Do not put pressure on any part of the cast until it is fully hardened. This may take several hours. If You Have a Walking Shoe:   Wear it as directed by your health care provider. Managing pain, stiffness, and swelling  If directed, apply ice to the injured area: ? Put ice in a plastic bag. ? Place a towel between your skin and the bag. ? Leave the ice on for 20 minutes, 2-3 times per day.  Move your fingers or toes often to avoid stiffness and to lessen swelling.  Raise the injured area above the level of your heart while you are sitting or lying down. Activity  Rest as directed by your health care provider. Ask your health care provider if you may do alternative exercises, such as swimming or biking, while you are healing.  Return to your normal activities as directed by your health care provider. Ask your health care provider what activities are safe for you.  Perform range-of-motion exercises only as directed by your health care provider. Safety  Do not use the injured limb to support   yourbody weight until your health care provider says that you can. Use crutches if your health care provider tells you to do so. General instructions  Do not use any tobacco products, including cigarettes, chewing tobacco, or electronic cigarettes. Tobacco can delay bone healing. If you need help quitting, ask your health care provider.  Take medicines only as directed by your health care provider.  Keep all follow-up visits as directed by your health care provider. This is important. How is this prevented?  Only wear shoes that: ? Fit well. ? Are  not worn out.  Eat a healthy diet that contains vitamin D and calcium. This helps keeps your bones strong.  Be careful when you start a new physical activity. Give your body time to adjust.  Avoid doing only one kind of activity. Do different exercises, such as swimming and running, so that no single part of your body gets overused.  Do strength-training exercises. Contact a health care provider if:  Your pain gets worse.  You have new symptoms.  You have increased swelling. Get help right away if:  You lose feeling in the affected area. This information is not intended to replace advice given to you by your health care provider. Make sure you discuss any questions you have with your health care provider. Document Released: 06/05/2002 Document Revised: 11/12/2015 Document Reviewed: 10/18/2013 Elsevier Interactive Patient Education  2018 Elsevier Inc.  

## 2018-01-03 NOTE — Progress Notes (Signed)
Subjective:    I'm seeing this patient as a consultation for: Dr. 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  He has the race in 10 days.  Over the past several days to weeks he is developed pain over the plantar aspect of the left fifth metatarsal shaft, moderate, persistent without radiation, no trauma.  Localized.  Worse when running, better with rest.  I reviewed the past medical history, family history, social history, surgical history, and allergies today and no changes were needed.  Please see the problem list section below in epic for further details.  Past Medical History: Past Medical History:  Diagnosis Date  . Hypothyroidism    Past Surgical History: No past surgical history on file. Social History: Social History   Socioeconomic History  . Marital status: Single    Spouse name: Not on file  . Number of children: Not on file  . Years of education: Not on file  . Highest education level: Not on file  Occupational History  . Not on file  Social Needs  . Financial resource strain: Not on file  . Food insecurity:    Worry: Not on file    Inability: Not on file  . Transportation needs:    Medical: Not on file    Non-medical: Not on file  Tobacco Use  . Smoking status: Never Smoker  . Smokeless tobacco: Never Used  Substance and Sexual Activity  . Alcohol use: Not on file  . Drug use: Not on file  . Sexual activity: Not on file  Lifestyle  . Physical activity:    Days per week: Not on file    Minutes per session: Not on file  . Stress: Not on file  Relationships  . Social connections:    Talks on phone: Not on file    Gets together: Not on file    Attends religious service: Not on file    Active member of club or organization: Not on file    Attends meetings of clubs or organizations: Not on file    Relationship status: Not on file  Other Topics  Concern  . Not on file  Social History Narrative  . Not on file   Family History: Family History  Problem Relation Age of Onset  . Sudden death Neg Hx   . Hyperlipidemia Neg Hx   . Heart attack Neg Hx   . Diabetes Neg Hx   . Hypertension Neg Hx    Allergies: No Known Allergies Medications: See med rec.  Review of Systems: No headache, visual changes, nausea, vomiting, diarrhea, constipation, dizziness, abdominal pain, skin rash, fevers, chills, night sweats, weight loss, swollen lymph nodes, body aches, joint swelling, muscle aches, chest pain, shortness of breath, mood changes, visual or auditory hallucinations.   Objective:   General: Well Developed, well nourished, and in no acute distress.  Neuro:  Extra-ocular muscles intact, able to move all 4 extremities, sensation grossly intact.  Deep tendon reflexes tested were normal. Psych: Alert and oriented, mood congruent with affect. ENT:  Ears and nose appear unremarkable.  Hearing grossly normal. Neck: Unremarkable overall appearance, trachea midline.  No visible thyroid enlargement. Eyes: Conjunctivae and lids appear unremarkable.  Pupils equal and round. Skin: Warm and dry, no rashes noted.  Cardiovascular: Pulses palpable, no extremity edema. Left foot: No visible erythema or swelling. Range of motion is full in all  directions. Strength is 5/5 in all directions. No hallux valgus. No pes cavus or pes planus. No abnormal callus noted. No pain over the navicular prominence, or base of fifth metatarsal. No tenderness to palpation of the calcaneal insertion of plantar fascia. No pain at the Achilles insertion. No pain over the calcaneal bursa. No pain of the retrocalcaneal bursa. Tender to palpation over the plantar shaft of the fifth metatarsal. No hallux rigidus or limitus. No tenderness palpation over interphalangeal joints. No pain with compression of the metatarsal heads. Neurovascularly intact distally.  Lateral  heel wedge placed in the left shoe, I gave him another one to place on his custom orthotic.  Impression and Recommendations:   This case required medical decision making of moderate complexity.  Stress fracture of left fifth metatarsal Lateral heel wedge in his daily shoes, custom orthotics. I did recommend a postop shoe except when training but he declines for now. I did recommend that he simply wear the most rigid soled shoe he can when not training. 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 X-rays today. ___________________________________________ Ihor Austin. Benjamin Stain, M.D., ABFM., CAQSM. Primary Care and Sports Medicine [facility]  Adjunct Instructor of Family Medicine  [employer]

## 2022-01-06 ENCOUNTER — Ambulatory Visit (INDEPENDENT_AMBULATORY_CARE_PROVIDER_SITE_OTHER): Payer: BC Managed Care – PPO

## 2022-01-06 ENCOUNTER — Ambulatory Visit: Payer: BC Managed Care – PPO | Admitting: Podiatry

## 2022-01-06 ENCOUNTER — Ambulatory Visit: Payer: BC Managed Care – PPO

## 2022-01-06 DIAGNOSIS — Q666 Other congenital valgus deformities of feet: Secondary | ICD-10-CM | POA: Diagnosis not present

## 2022-01-06 DIAGNOSIS — M722 Plantar fascial fibromatosis: Secondary | ICD-10-CM

## 2022-01-06 NOTE — Progress Notes (Signed)
  Subjective:  Patient ID: Alexander Walsh, male    DOB: 1981/03/09,  MRN: [id number]  Chief Complaint  Patient presents with   Plantar Fasciitis    On going for about 1 year  , pain varies pain is a sharp pain. Patient states he is a runner that can not run that much anymore. No swelling or redness   Foot Orthotics     Patient is interested in orthotics     41 y.o. male presents with the above complaint.  Patient presents with complaint of flatfoot deformity bilateral foot.  Patient states that he has been having Planter fasciitis problems for a long long time.  He has been stretching it out and dealing with it himself.  He wanted to get it evaluated.  He has a history of Planter fasciitis.  Causing him any pain.  He does a lot of running.  He does not run as much anymore.  He would like to discuss treatment options for this pain scale is 2 out of 10.   Review of Systems: Negative except as noted in the HPI. Denies N/V/F/Ch.  Past Medical History:  Diagnosis Date   Hypothyroidism     Current Outpatient Medications:    levothyroxine (SYNTHROID, LEVOTHROID) 125 MCG tablet, Take 125 mcg by mouth daily before breakfast., Disp: , Rfl:    Vitamin D, Ergocalciferol, (DRISDOL) 50000 UNITS CAPS capsule, , Disp: , Rfl:   Social History   Tobacco Use  Smoking Status Never  Smokeless Tobacco Never    No Known Allergies Objective:  There were no vitals filed for this visit. There is no height or weight on file to calculate BMI. Constitutional Well developed. Well nourished.  Vascular Dorsalis pedis pulses palpable bilaterally. Posterior tibial pulses palpable bilaterally. Capillary refill normal to all digits.  No cyanosis or clubbing noted. Pedal hair growth normal.  Neurologic Normal speech. Oriented to person, place, and time. Epicritic sensation to light touch grossly present bilaterally.  Dermatologic Nails well groomed and normal in appearance. No open wounds. No skin  lesions.  Orthopedic: Gait examination shows pes planovalgus foot type with calcaneovalgus to many toe signs partially recruit the arch with dorsiflexion.   Radiographs: 3 views of skeletally mature adult bilateral foot: Mild pes planovalgus foot deformity noted.  No bunion deformity noted.  No bony abnormalities identified.  No fractures noted Assessment:   1. Pes planovalgus    Plan:  Patient was evaluated and treated and all questions answered.  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  I discussed the importance of custom-made orthotics I also discussed shoe gear modification as well -Pes planovalgus -I explained to patient the etiology of pes planovalgus and relationship with Planter fasciitis and various treatment options were discussed.  Given patient foot structure in the setting of Planter fasciitis I believe patient will benefit from custom-made orthotics to help control the hindfoot motion support the arch of the foot and take the stress away from plantar fascial.  Patient agrees with the plan like to proceed with orthotics -Patient was casted for orthotics  -  No follow-ups on file.

## 2022-01-12 ENCOUNTER — Encounter: Payer: Self-pay | Admitting: Podiatry

## 2022-01-26 ENCOUNTER — Telehealth: Payer: Self-pay | Admitting: Podiatry

## 2022-01-26 NOTE — Telephone Encounter (Signed)
lvm for patient to contact office to sched appointment  for opu  

## 2022-02-23 NOTE — Telephone Encounter (Signed)
Lmom for pt to call back - message was left on vm about scheduling time to pick up orthotics

## 2022-02-25 ENCOUNTER — Ambulatory Visit (INDEPENDENT_AMBULATORY_CARE_PROVIDER_SITE_OTHER): Payer: Self-pay

## 2022-02-25 DIAGNOSIS — Q666 Other congenital valgus deformities of feet: Secondary | ICD-10-CM

## 2022-02-25 NOTE — Progress Notes (Signed)
Patient presents today to pick up custom molded foot orthotics, diagnosed with pes planovalgus by Dr. Allena Katz.   Orthotics were dispensed and fit was satisfactory. Reviewed instructions for break-in and wear. Written instructions given to patient.  Patient will follow up as needed.   [facility] - order # [id number]

## 2022-05-20 ENCOUNTER — Telehealth: Payer: Self-pay | Admitting: Podiatry

## 2022-05-20 ENCOUNTER — Ambulatory Visit: Payer: BC Managed Care – PPO | Admitting: Podiatry

## 2022-05-20 DIAGNOSIS — M722 Plantar fascial fibromatosis: Secondary | ICD-10-CM | POA: Diagnosis not present

## 2022-05-20 NOTE — Telephone Encounter (Signed)
Order 2nd pair of orthotics

## 2022-05-20 NOTE — Progress Notes (Signed)
  Subjective:  Patient ID: Alexander Walsh, male    DOB: 10-19-1980,  MRN: [id number]  Chief Complaint  Patient presents with   Plantar Fasciitis    42 y.o. male presents with the above complaint.  Patient presents with complaint left heel pain.  Patient states that he is noticing morning For a while he runs many miles a day started noticing of 4 mile marker.  He went to get it evaluated he has not seen anyone else prior to seeing me.  He would like to discuss treatment options for this.  Pain scale is 5 out of 10 notices mostly when he is ambulating he has been wearing his orthotics he would like to get another pair as well   Review of Systems: Negative except as noted in the HPI. Denies N/V/F/Ch.  Past Medical History:  Diagnosis Date   Hypothyroidism     Current Outpatient Medications:    levothyroxine (SYNTHROID, LEVOTHROID) 125 MCG tablet, Take 125 mcg by mouth daily before breakfast., Disp: , Rfl:    Vitamin D, Ergocalciferol, (DRISDOL) 50000 UNITS CAPS capsule, , Disp: , Rfl:   Social History   Tobacco Use  Smoking Status Never  Smokeless Tobacco Never    No Known Allergies Objective:  There were no vitals filed for this visit. There is no height or weight on file to calculate BMI. Constitutional Well developed. Well nourished.  Vascular Dorsalis pedis pulses palpable bilaterally. Posterior tibial pulses palpable bilaterally. Capillary refill normal to all digits.  No cyanosis or clubbing noted. Pedal hair growth normal.  Neurologic Normal speech. Oriented to person, place, and time. Epicritic sensation to light touch grossly present bilaterally.  Dermatologic Nails well groomed and normal in appearance. No open wounds. No skin lesions.  Orthopedic: Normal joint ROM without pain or crepitus bilaterally. No visible deformities. Tender to palpation at the calcaneal tuber left. No pain with calcaneal squeeze left. Ankle ROM diminished range of motion  left. Silfverskiold Test: positive left.   Radiographs: None  Assessment:   1. Plantar fasciitis of left foot    Plan:  Patient was evaluated and treated and all questions answered.  Plantar Fasciitis, left - XR reviewed as above.  - Educated on icing and stretching. Instructions given.  - Injection delivered to the plantar fascia as below. - DME: Plantar fascial brace dispensed to support the medial longitudinal arch of the foot and offload pressure from the heel and prevent arch collapse during weightbearing - Pharmacologic management: None -Patient will be given a second pair of orthotics as well.  Order was placed  Procedure: Injection Tendon/Ligament Location: Left plantar fascia at the glabrous junction; medial approach. Skin Prep: alcohol Injectate: 0.5 cc 0.5% marcaine plain, 0.5 cc of 1% Lidocaine, 0.5 cc kenalog 10. Disposition: Patient tolerated procedure well. Injection site dressed with a band-aid.  No follow-ups on file.  Left PF injection brace   Second pair of orthotics

## 2022-06-17 ENCOUNTER — Ambulatory Visit: Payer: BC Managed Care – PPO | Admitting: Podiatry

## 2022-07-15 ENCOUNTER — Ambulatory Visit: Payer: BC Managed Care – PPO | Admitting: Podiatry

## 2022-11-05 ENCOUNTER — Ambulatory Visit: Payer: BC Managed Care – PPO | Admitting: Podiatry
